# Patient Record
Sex: Female | Born: 1985 | Race: White | Hispanic: No | Marital: Married | Smoking: Never smoker
Health system: Southern US, Community
[De-identification: ages and names within clinical notes are randomized; demographics above are authoritative.]

## PROBLEM LIST (undated history)

## (undated) DIAGNOSIS — O24419 Gestational diabetes mellitus in pregnancy, unspecified control: Secondary | ICD-10-CM

## (undated) HISTORY — PX: BREAST SURGERY: SHX581

---

## 2010-07-30 LAB — HEPATITIS B SURFACE ANTIGEN: Hepatitis B Surface Ag: NEGATIVE

## 2010-07-30 LAB — RUBELLA ANTIBODY, IGM: Rubella: IMMUNE

## 2010-07-30 LAB — TYPE AND SCREEN: Antibody Screen: NEGATIVE

## 2010-07-30 LAB — GC/CHLAMYDIA PROBE AMP, GENITAL: Chlamydia: NEGATIVE

## 2011-01-30 LAB — STREP B DNA PROBE: GBS: NEGATIVE

## 2011-02-24 ENCOUNTER — Inpatient Hospital Stay (HOSPITAL_COMMUNITY): Admission: AD | Admit: 2011-02-24 | Payer: Self-pay | Source: Ambulatory Visit | Admitting: Obstetrics and Gynecology

## 2011-02-25 ENCOUNTER — Inpatient Hospital Stay (HOSPITAL_COMMUNITY)
Admission: RE | Admit: 2011-02-25 | Discharge: 2011-02-26 | DRG: 372 | Disposition: A | Discharge status: Home / Self Care | Payer: BC Managed Care – PPO | Source: Ambulatory Visit | Attending: Obstetrics and Gynecology | Admitting: Obstetrics and Gynecology

## 2011-02-25 ENCOUNTER — Encounter (HOSPITAL_COMMUNITY): Payer: Self-pay | Admitting: Anesthesiology

## 2011-02-25 ENCOUNTER — Encounter (HOSPITAL_COMMUNITY): Payer: Self-pay

## 2011-02-25 ENCOUNTER — Inpatient Hospital Stay (HOSPITAL_COMMUNITY): Payer: BC Managed Care – PPO | Admitting: Anesthesiology

## 2011-02-25 DIAGNOSIS — O24410 Gestational diabetes mellitus in pregnancy, diet controlled: Secondary | ICD-10-CM

## 2011-02-25 DIAGNOSIS — O34219 Maternal care for unspecified type scar from previous cesarean delivery: Secondary | ICD-10-CM | POA: Diagnosis present

## 2011-02-25 DIAGNOSIS — O99814 Abnormal glucose complicating childbirth: Principal | ICD-10-CM | POA: Diagnosis present

## 2011-02-25 HISTORY — DX: Gestational diabetes mellitus in pregnancy, unspecified control: O24.419

## 2011-02-25 LAB — CBC
HCT: 38.9 % (ref 36.0–46.0)
Hemoglobin: 13.6 g/dL (ref 12.0–15.0)
MCV: 96.5 fL (ref 78.0–100.0)
WBC: 14.3 10*3/uL — ABNORMAL HIGH (ref 4.0–10.5)

## 2011-02-25 MED ORDER — DIPHENHYDRAMINE HCL 50 MG/ML IJ SOLN: 12.5000 mg | INTRAMUSCULAR | Status: DC | PRN

## 2011-02-25 MED ORDER — SIMETHICONE 80 MG PO CHEW: 80.0000 mg | CHEWABLE_TABLET | ORAL | Status: DC | PRN

## 2011-02-25 MED ORDER — ONDANSETRON HCL 4 MG PO TABS: 4.0000 mg | ORAL_TABLET | ORAL | Status: DC | PRN

## 2011-02-25 MED ORDER — MEASLES, MUMPS & RUBELLA VAC ~~LOC~~ INJ
0.5000 mL | INJECTION | Freq: Once | SUBCUTANEOUS | Status: DC
  Filled 2011-02-25: qty 0.5

## 2011-02-25 MED ORDER — EPHEDRINE 5 MG/ML INJ
10.0000 mg | INTRAVENOUS | Status: DC | PRN
  Filled 2011-02-25: qty 4

## 2011-02-25 MED ORDER — CITRIC ACID-SODIUM CITRATE 334-500 MG/5ML PO SOLN: 30.0000 mL | ORAL | Status: DC | PRN

## 2011-02-25 MED ORDER — OXYTOCIN 20 UNITS IN LACTATED RINGERS INFUSION - SIMPLE: 1.0000 m[IU]/min | INTRAVENOUS | Status: DC

## 2011-02-25 MED ORDER — FLEET ENEMA 7-19 GM/118ML RE ENEM: 1.0000 | ENEMA | RECTAL | Status: DC | PRN

## 2011-02-25 MED ORDER — WITCH HAZEL-GLYCERIN EX PADS
1.0000 "application " | MEDICATED_PAD | CUTANEOUS | Status: DC | PRN
  Administered 2011-02-25: 1 via TOPICAL

## 2011-02-25 MED ORDER — OXYTOCIN 20 UNITS IN LACTATED RINGERS INFUSION - SIMPLE
125.0000 mL/h | Freq: Once | INTRAVENOUS | Status: DC
  Administered 2011-02-25: 125 mL/h via INTRAVENOUS
  Filled 2011-02-25: qty 1000

## 2011-02-25 MED ORDER — FENTANYL 2.5 MCG/ML BUPIVACAINE 1/10 % EPIDURAL INFUSION (WH - ANES)
14.0000 mL/h | INTRAMUSCULAR | Status: DC
  Administered 2011-02-25: 13 mL/h via EPIDURAL
  Filled 2011-02-25: qty 60

## 2011-02-25 MED ORDER — IBUPROFEN 600 MG PO TABS
600.0000 mg | ORAL_TABLET | Freq: Four times a day (QID) | ORAL | Status: DC | PRN
  Administered 2011-02-25: 600 mg via ORAL
  Filled 2011-02-25: qty 1

## 2011-02-25 MED ORDER — PRENATAL PLUS 27-1 MG PO TABS
1.0000 | ORAL_TABLET | Freq: Every day | ORAL | Status: DC
  Administered 2011-02-25 – 2011-02-26 (×2): 1 via ORAL
  Filled 2011-02-25 (×2): qty 1

## 2011-02-25 MED ORDER — LACTATED RINGERS IV SOLN: 500.0000 mL | INTRAVENOUS | Status: DC | PRN

## 2011-02-25 MED ORDER — SENNOSIDES-DOCUSATE SODIUM 8.6-50 MG PO TABS
2.0000 | ORAL_TABLET | Freq: Every day | ORAL | Status: DC
  Administered 2011-02-25: 2 via ORAL

## 2011-02-25 MED ORDER — LACTATED RINGERS IV SOLN
500.0000 mL | Freq: Once | INTRAVENOUS | Status: AC
  Administered 2011-02-25: 12:00:00 via INTRAVENOUS

## 2011-02-25 MED ORDER — ONDANSETRON HCL 4 MG/2ML IJ SOLN: 4.0000 mg | Freq: Four times a day (QID) | INTRAMUSCULAR | Status: DC | PRN

## 2011-02-25 MED ORDER — MEDROXYPROGESTERONE ACETATE 150 MG/ML IM SUSP: 150.0000 mg | INTRAMUSCULAR | Status: DC | PRN

## 2011-02-25 MED ORDER — PHENYLEPHRINE 40 MCG/ML (10ML) SYRINGE FOR IV PUSH (FOR BLOOD PRESSURE SUPPORT)
80.0000 ug | PREFILLED_SYRINGE | INTRAVENOUS | Status: DC | PRN
  Filled 2011-02-25 (×2): qty 5

## 2011-02-25 MED ORDER — TERBUTALINE SULFATE 1 MG/ML IJ SOLN: 0.2500 mg | Freq: Once | INTRAMUSCULAR | Status: DC | PRN

## 2011-02-25 MED ORDER — LACTATED RINGERS IV SOLN
INTRAVENOUS | Status: DC
  Administered 2011-02-25 (×2): via INTRAVENOUS

## 2011-02-25 MED ORDER — ACETAMINOPHEN 325 MG PO TABS: 650.0000 mg | ORAL_TABLET | ORAL | Status: DC | PRN

## 2011-02-25 MED ORDER — DIBUCAINE 1 % RE OINT: 1.0000 "application " | TOPICAL_OINTMENT | RECTAL | Status: DC | PRN

## 2011-02-25 MED ORDER — TETANUS-DIPHTH-ACELL PERTUSSIS 5-2.5-18.5 LF-MCG/0.5 IM SUSP
0.5000 mL | Freq: Once | INTRAMUSCULAR | Status: DC
  Filled 2011-02-25: qty 0.5

## 2011-02-25 MED ORDER — BENZOCAINE-MENTHOL 20-0.5 % EX AERO: 1.0000 "application " | INHALATION_SPRAY | CUTANEOUS | Status: DC | PRN

## 2011-02-25 MED ORDER — OXYCODONE-ACETAMINOPHEN 5-325 MG PO TABS: 2.0000 | ORAL_TABLET | ORAL | Status: DC | PRN

## 2011-02-25 MED ORDER — DIPHENHYDRAMINE HCL 25 MG PO CAPS: 25.0000 mg | ORAL_CAPSULE | Freq: Four times a day (QID) | ORAL | Status: DC | PRN

## 2011-02-25 MED ORDER — EPHEDRINE 5 MG/ML INJ
10.0000 mg | INTRAVENOUS | Status: DC | PRN
  Filled 2011-02-25 (×2): qty 4

## 2011-02-25 MED ORDER — ONDANSETRON HCL 4 MG/2ML IJ SOLN: 4.0000 mg | INTRAMUSCULAR | Status: DC | PRN

## 2011-02-25 MED ORDER — LIDOCAINE HCL (PF) 1 % IJ SOLN
30.0000 mL | INTRAMUSCULAR | Status: DC | PRN
  Administered 2011-02-25: 30 mL via SUBCUTANEOUS
  Filled 2011-02-25: qty 30

## 2011-02-25 MED ORDER — PHENYLEPHRINE 40 MCG/ML (10ML) SYRINGE FOR IV PUSH (FOR BLOOD PRESSURE SUPPORT)
80.0000 ug | PREFILLED_SYRINGE | INTRAVENOUS | Status: DC | PRN
  Filled 2011-02-25: qty 5

## 2011-02-25 MED ORDER — IBUPROFEN 600 MG PO TABS
600.0000 mg | ORAL_TABLET | Freq: Four times a day (QID) | ORAL | Status: DC
  Administered 2011-02-25 – 2011-02-26 (×4): 600 mg via ORAL
  Filled 2011-02-25 (×4): qty 1

## 2011-02-25 MED ORDER — OXYCODONE-ACETAMINOPHEN 5-325 MG PO TABS
1.0000 | ORAL_TABLET | ORAL | Status: DC | PRN
  Filled 2011-02-25: qty 1

## 2011-02-25 MED ORDER — LANOLIN HYDROUS EX OINT: TOPICAL_OINTMENT | CUTANEOUS | Status: DC | PRN

## 2011-02-25 NOTE — Progress Notes (Signed)
Getting comfortable w/ epidural.  Was having increased ctx pain.  FHT reassuring w/ occasional late decels after epidural.  Improving now with position change Toco Q3 Cvx 4/90/-1 per RN exam  A/P:  Exp mngt.

## 2011-02-25 NOTE — Progress Notes (Signed)
No complaints  FHT reassuring w/ accels Toco Q5-7 Cvx 3/50/-2  A/P:  Pt declines pitocin,  rec to recheck in 1hr, if no change will start pitocin.

## 2011-02-25 NOTE — Anesthesia Preprocedure Evaluation (Signed)
Anesthesia Evaluation  Name, MR# and DOB Patient awake  General Assessment Comment  Reviewed: Allergy & Precautions, H&P  and Patient's Chart, lab work & pertinent test results  Airway Mallampati: IV TM Distance: >3 FB Neck ROM: full    Dental No notable dental hx    Pulmonaryneg pulmonary ROS    clear to auscultation  pulmonary exam normal   Cardiovascular regular Normal   Neuro/PsychNegative Neurological ROS Negative Psych ROS  GI/Hepatic/Renal negative GI ROS, negative Liver ROS, and negative Renal ROS (+)       Endo/Other  Negative Endocrine ROS (+) Diabetes mellitus- Morbid obesity Abdominal   Musculoskeletal  Hematology negative hematology ROS (+)   Peds  Reproductive/Obstetrics (+) Pregnancy   Anesthesia Other Findings             Anesthesia Physical Anesthesia Plan  ASA: III  Anesthesia Plan: Epidural   Post-op Pain Management:    Induction:   Airway Management Planned:   Additional Equipment:   Intra-op Plan:   Post-operative Plan:   Informed Consent: I have reviewed the patients History and Physical, chart, labs and discussed the procedure including the risks, benefits and alternatives for the proposed anesthesia with the patient or authorized representative who has indicated his/her understanding and acceptance.     Plan Discussed with:   Anesthesia Plan Comments:         Anesthesia Quick Evaluation

## 2011-02-25 NOTE — H&P (Signed)
Subjective:  Helen Ingram is a 25 y.o. G2 P1 at [redacted] weeks gestation who is being admitted for induction of labor.  Her current obstetrical history is significant for gestational DM.  Patient reports occasional contractions.   Fetal Movement: normal.     Objective:   Vital signs in last 24 hours: Temp:  [98.8 F (37.1 C)] 98.8 F (37.1 C) (08/02 0757) Weight:  [105.688 kg (233 lb)] 233 lb (105.688 kg) (08/02 0757)   General:   alert and cooperative  Skin:   normal  HEENT:  extra ocular movement intact  Lungs:   clear to auscultation bilaterally  Heart:   regular rate and rhythm  Breasts:  deferred  Abdomen:  gravid, NT  Pelvis:  Exam deferred.  FHT:  reassuring  Uterine Size: size equals dates  Presentations: cephalic  Cervix:    Dilation: 3cm   Effacement: 50%   Station:  -2   Consistency: soft   Position: middle   Lab Review  GBS negative  One hour GTT: she is diet controlled    Assessment/Plan:  [redacted] weeks gestation. VBAC Obstetrical history significant for gestational DM, diet controlled.     Risks, benefits, alternatives and possible complications have been discussed in detail with the patient.  Pre-admission, admission, and post admission procedures and expectations were discussed in detail.  All questions answered, all appropriate consents will be signed at the Hospital. Admission is planned for today.  Plan for AROM and pitocin induction if needed after AROM.  R/B/A of inducion of labor and VBAC d/w pt and husband including increased risk for uterine rupture which can lead to maternal and fetal morbidity and mortality.  informed consent obtained.  Pt declines repeat c-section

## 2011-02-25 NOTE — Progress Notes (Signed)
Delivery Note At 12:53 PM a viable female was delivered via SVD.  APGAR: 9 , 9 ; weight pending .   Placenta status: spontaneous, intact w/ 3VC. Cord pH: pending  Anesthesia:  Epidural, local Episiotomy: none Lacerations: 2nd degree Suture Repair: 3.0 vicryl rapide Est. Blood Loss (mL): <500cc  Mom to postpartum.  Baby to nursery-stable.  Micholas Drumwright 02/25/2011, 1:14 PM

## 2011-02-25 NOTE — Progress Notes (Signed)
Basic teaching. IN shells.  DEBP r/t  Br. Red. F/U tomorrow.

## 2011-02-25 NOTE — Progress Notes (Signed)
No complaints,  FHT reassuring Toco: light, Q3-74min Cvx: 3/50/-2 AROM:  Clear  A/P:  Recheck cvx in 2hrs, if no change will augment w/ pitocin

## 2011-02-25 NOTE — Progress Notes (Signed)
Pt feeling sharp pains with Dr Renaldo Fiddler suturing, MD using lidocaine

## 2011-02-25 NOTE — Anesthesia Procedure Notes (Signed)
Epidural Patient location during procedure: OB Start time: 02/25/2011 11:39 AM  Staffing Performed by: anesthesiologist   Preanesthetic Checklist Completed: patient identified, site marked, surgical consent, pre-op evaluation, timeout performed, IV checked, risks and benefits discussed and monitors and equipment checked  Epidural Patient position: sitting Prep: site prepped and draped and DuraPrep Patient monitoring: continuous pulse ox and blood pressure Approach: midline Injection technique: LOR saline  Needle:  Needle type: Tuohy  Needle gauge: 17 G Needle length: 9 cm Needle insertion depth: 7 cm Catheter type: closed end flexible Catheter size: 19 Gauge Catheter at skin depth: 12 cm Test dose: negative  Assessment Events: blood not aspirated, injection not painful, no injection resistance, negative IV test and no paresthesia  Additional Notes Patient identified.  Risk benefits discussed including failed block, incomplete pain control, headache, nerve damage, paralysis, blood pressure changes, nausea, vomiting, reactions to medication both toxic or allergic, and postpartum back pain.  Patient expressed understanding and wished to proceed.  All questions were answered.  Sterile technique used throughout procedure and epidural site dressed with sterile barrier dressing. No paresthesia or other complications noted. 5 minutes prior to starting epidural infusion of fentanyl and bupivicaine, the epidural was loaded with 9 ml of 1.5% lidocaine in three separate doses.  The patient did not experience any signs of intravascular injection such as tinnitus or metallic taste in mouth nor signs of intrathecal spread such as rapid motor block. Please see nursing notes for vital signs.

## 2011-02-25 NOTE — Progress Notes (Signed)
Assumed care of pt from [facility] RN for transfer. Report given to [facility]

## 2011-02-26 ENCOUNTER — Encounter (HOSPITAL_COMMUNITY)
Admission: RE | Admit: 2011-02-26 | Discharge: 2011-02-26 | Disposition: A | Discharge status: Home / Self Care | Payer: BC Managed Care – PPO | Source: Ambulatory Visit | Attending: Obstetrics and Gynecology | Admitting: Obstetrics and Gynecology

## 2011-02-26 DIAGNOSIS — O923 Agalactia: Secondary | ICD-10-CM | POA: Insufficient documentation

## 2011-02-26 LAB — CBC
HCT: 34.4 % — ABNORMAL LOW (ref 36.0–46.0)
MCH: 32.8 pg (ref 26.0–34.0)
MCV: 98 fL (ref 78.0–100.0)
RDW: 13.8 % (ref 11.5–15.5)
WBC: 15 10*3/uL — ABNORMAL HIGH (ref 4.0–10.5)

## 2011-02-26 MED ORDER — IBUPROFEN 600 MG PO TABS: 600.0000 mg | ORAL_TABLET | Freq: Four times a day (QID) | ORAL | Status: AC

## 2011-02-26 NOTE — Progress Notes (Signed)
2nd visit : Mom trying to latch on left breast , more challenging latch , infant unable sustain latch completely over the aerolo ,added SNS with aliitle  More success . Took about 5ml of formula . Unable to keep latched on left breast to be considered a good latch . Changed back to right breast with SNS AND WAS ABLE TO SUSTAIN LATCH WITH GOOD DEPTH AND CONSISTENT SWALLOWS AND PATTERN . TOOK MORE FORMULA FROM SNS.SINCE Mom and Dad requested early D/C and pediatrician wrote D/c - Plan of Care as follows. Parents renting [employer] with instructions, LC recommended to massage breast ,hand express and prepump both breast 3-57min. Then have Dad set up SNS with formula or EBM , Work on latching on the breast that is easy to latch (which is the right ) with SNS . If unable to latch on the left , pump . Plan to followup with "WEIGHT CHECKS -2 in 1st week of life and weekly until milk supply is determined. Stressed the importance to parents caloric intake for infant and also working on milk supply . Also encouraged lots of STS ,especially with feedings . Reminded mom the [facility] O[facility]P apts. Encouraged to call once milk is in. Marissa Cox RN had mentioned to [employer] parents were questioning whether they would go home today .

## 2011-02-26 NOTE — Progress Notes (Signed)
Post Partum Day 1 Subjective: no complaints, up ad lib and voiding. Desires early discharge  Objective: Blood pressure 109/66, pulse 72, temperature 98.1 F (36.7 C), temperature source Oral, resp. rate 18, height 5\' 4"  (1.626 m), weight 105.688 kg (233 lb), SpO2 98.00%, unknown if currently breastfeeding.  Physical Exam:  General: alert and cooperative Lochia: appropriate Uterine Fundus: firm Perineum intact DVT Evaluation: No evidence of DVT seen on physical exam.   Basename 02/26/11 0520 02/25/11 0805  HGB 11.5* 13.6  HCT 34.4* 38.9    Assessment/Plan: Discharge home   LOS: 1 day   CURTIS,CAROL G 02/26/2011, 7:55 AM

## 2011-02-26 NOTE — Progress Notes (Addendum)
Lactation Consult  Per: Mom attempt with 1st baby., (already had breast reduction ) . Per mom very little yield . According to the chart latch scores have been 4-5 . Today infant has been sleepy , @consult  woke infant up showed mom how to breast massage , hand express with several drops of clostrum noted . After several attempts latched with assistance by sandwich compressable aerolo and infant was able to sustain latch for 20 min with some swallows. Mom and dad very pleased . Discussed with Mom and dad with Hx. Of Breast reduction it is a wait and see situation , and consistent pumping  is majorly important .

## 2011-02-26 NOTE — Discharge Summary (Signed)
Obstetric Discharge Summary Reason for Admission: induction of labor Prenatal Procedures: none Intrapartum Procedures: spontaneous vaginal delivery Postpartum Procedures: none Complications-Operative and Postpartum: 2 degree perineal laceration Hemoglobin  Date Value Range Status  02/26/2011 11.5* 12.0-15.0 (g/dL) Final     HCT  Date Value Range Status  02/26/2011 34.4* 36.0-46.0 (%) Final    Discharge Diagnoses: Term Pregnancy-delivered  Discharge Information: Date: 02/26/2011 Activity: pelvic rest Diet: routine Medications: PNV and Ibuprophen Condition: stable Instructions: refer to practice specific booklet Discharge to: home   Newborn Data: Live born female  Birth Weight: 7 lb 1.6 oz (3221 g) APGAR: 9, 9  Home with mother.  CURTIS,CAROL G 02/26/2011, 8:04 AM

## 2011-02-27 NOTE — Anesthesia Postprocedure Evaluation (Signed)
  Anesthesia Post Note  Patient: Helen Ingram  Procedure(s) Performed: * No procedures listed *  Anesthesia type: Epidural  Patient location: Mother/Baby  Post pain: Pain level controlled  Post assessment: Post-op Vital signs reviewed  Last Vitals: There were no vitals filed for this visit.  Post vital signs: Reviewed  Level of consciousness: awake  Complications: No apparent anesthesia complications

## 2011-03-30 ENCOUNTER — Encounter (HOSPITAL_COMMUNITY)
Admission: RE | Admit: 2011-03-30 | Discharge: 2011-03-30 | Disposition: A | Discharge status: Home / Self Care | Payer: BC Managed Care – PPO | Source: Ambulatory Visit | Attending: Obstetrics and Gynecology | Admitting: Obstetrics and Gynecology

## 2011-03-30 DIAGNOSIS — O923 Agalactia: Secondary | ICD-10-CM | POA: Insufficient documentation

## 2013-04-03 ENCOUNTER — Other Ambulatory Visit: Payer: Self-pay | Admitting: Obstetrics and Gynecology

## 2013-04-03 DIAGNOSIS — Z9889 Other specified postprocedural states: Secondary | ICD-10-CM

## 2013-04-03 DIAGNOSIS — N644 Mastodynia: Secondary | ICD-10-CM

## 2013-04-06 ENCOUNTER — Ambulatory Visit
Admission: RE | Admit: 2013-04-06 | Discharge: 2013-04-06 | Disposition: A | Discharge status: Home / Self Care | Payer: BC Managed Care – PPO | Source: Ambulatory Visit | Attending: Obstetrics and Gynecology | Admitting: Obstetrics and Gynecology

## 2013-04-06 DIAGNOSIS — N644 Mastodynia: Secondary | ICD-10-CM

## 2013-04-06 DIAGNOSIS — Z9889 Other specified postprocedural states: Secondary | ICD-10-CM

## 2014-05-27 ENCOUNTER — Encounter (HOSPITAL_COMMUNITY): Payer: Self-pay

## 2015-07-27 NOTE — L&D Delivery Note (Signed)
Delivery Note/VBAC  SVD viable female Apgars 9,9 over 1st degree ML Lac.  Placenta delivered spontaneously intact with 3VC. Repair with 3-0 Chromic with good support and hemostasis noted and R/V exam confirms.  PH art was sent.  [place] cord blood was not done.  Mother and baby were doing well.  EBL 100cc  Candice Campavid Sheri Prows, MD

## 2015-09-29 LAB — OB RESULTS CONSOLE ABO/RH: RH Type: POSITIVE

## 2015-09-29 LAB — OB RESULTS CONSOLE GC/CHLAMYDIA
CHLAMYDIA, DNA PROBE: NEGATIVE
GC PROBE AMP, GENITAL: NEGATIVE

## 2015-09-29 LAB — OB RESULTS CONSOLE RUBELLA ANTIBODY, IGM: RUBELLA: IMMUNE

## 2015-09-29 LAB — OB RESULTS CONSOLE RPR: RPR: NONREACTIVE

## 2015-09-29 LAB — OB RESULTS CONSOLE ANTIBODY SCREEN: Antibody Screen: NEGATIVE

## 2015-09-29 LAB — OB RESULTS CONSOLE HEPATITIS B SURFACE ANTIGEN: Hepatitis B Surface Ag: NEGATIVE

## 2015-09-29 LAB — OB RESULTS CONSOLE HIV ANTIBODY (ROUTINE TESTING): HIV: NONREACTIVE

## 2016-04-22 ENCOUNTER — Telehealth (HOSPITAL_COMMUNITY): Payer: Self-pay | Admitting: *Deleted

## 2016-04-22 ENCOUNTER — Encounter (HOSPITAL_COMMUNITY): Payer: Self-pay | Admitting: *Deleted

## 2016-04-22 NOTE — Telephone Encounter (Signed)
Preadmission screen  

## 2016-04-27 ENCOUNTER — Inpatient Hospital Stay (HOSPITAL_COMMUNITY): Payer: BLUE CROSS/BLUE SHIELD | Admitting: Anesthesiology

## 2016-04-27 ENCOUNTER — Encounter (HOSPITAL_COMMUNITY): Payer: Self-pay

## 2016-04-27 ENCOUNTER — Inpatient Hospital Stay (HOSPITAL_COMMUNITY)
Admission: RE | Admit: 2016-04-27 | Discharge: 2016-04-28 | DRG: 775 | Disposition: A | Discharge status: Home / Self Care | Payer: BLUE CROSS/BLUE SHIELD | Source: Ambulatory Visit | Attending: Obstetrics and Gynecology | Admitting: Obstetrics and Gynecology

## 2016-04-27 DIAGNOSIS — Z823 Family history of stroke: Secondary | ICD-10-CM

## 2016-04-27 DIAGNOSIS — O24425 Gestational diabetes mellitus in childbirth, controlled by oral hypoglycemic drugs: Secondary | ICD-10-CM | POA: Diagnosis present

## 2016-04-27 DIAGNOSIS — Z8249 Family history of ischemic heart disease and other diseases of the circulatory system: Secondary | ICD-10-CM | POA: Diagnosis not present

## 2016-04-27 DIAGNOSIS — Z3490 Encounter for supervision of normal pregnancy, unspecified, unspecified trimester: Secondary | ICD-10-CM

## 2016-04-27 DIAGNOSIS — O34211 Maternal care for low transverse scar from previous cesarean delivery: Secondary | ICD-10-CM | POA: Diagnosis present

## 2016-04-27 DIAGNOSIS — Z3A38 38 weeks gestation of pregnancy: Secondary | ICD-10-CM

## 2016-04-27 DIAGNOSIS — Z7984 Long term (current) use of oral hypoglycemic drugs: Secondary | ICD-10-CM | POA: Diagnosis not present

## 2016-04-27 DIAGNOSIS — Z833 Family history of diabetes mellitus: Secondary | ICD-10-CM | POA: Diagnosis not present

## 2016-04-27 LAB — TYPE AND SCREEN
ABO/RH(D): A POS
Antibody Screen: NEGATIVE

## 2016-04-27 LAB — CBC
HEMATOCRIT: 38.1 % (ref 36.0–46.0)
HEMOGLOBIN: 13.3 g/dL (ref 12.0–15.0)
MCH: 33.3 pg (ref 26.0–34.0)
MCHC: 34.9 g/dL (ref 30.0–36.0)
MCV: 95.5 fL (ref 78.0–100.0)
Platelets: 179 10*3/uL (ref 150–400)
RBC: 3.99 MIL/uL (ref 3.87–5.11)
RDW: 13.3 % (ref 11.5–15.5)
WBC: 8.6 10*3/uL (ref 4.0–10.5)

## 2016-04-27 LAB — GLUCOSE, CAPILLARY
GLUCOSE-CAPILLARY: 70 mg/dL (ref 65–99)
Glucose-Capillary: 87 mg/dL (ref 65–99)

## 2016-04-27 LAB — RPR: RPR Ser Ql: NONREACTIVE

## 2016-04-27 LAB — GLUCOSE, RANDOM: GLUCOSE: 96 mg/dL (ref 65–99)

## 2016-04-27 LAB — ABO/RH: ABO/RH(D): A POS

## 2016-04-27 LAB — OB RESULTS CONSOLE GBS: STREP GROUP B AG: NEGATIVE

## 2016-04-27 MED ORDER — LIDOCAINE HCL (PF) 1 % IJ SOLN
30.0000 mL | INTRAMUSCULAR | Status: DC | PRN
  Filled 2016-04-27: qty 30

## 2016-04-27 MED ORDER — OXYCODONE-ACETAMINOPHEN 5-325 MG PO TABS: 1.0000 | ORAL_TABLET | ORAL | Status: DC | PRN

## 2016-04-27 MED ORDER — COCONUT OIL OIL: 1.0000 "application " | TOPICAL_OIL | Status: DC | PRN

## 2016-04-27 MED ORDER — ACETAMINOPHEN 325 MG PO TABS: 650.0000 mg | ORAL_TABLET | ORAL | Status: DC | PRN

## 2016-04-27 MED ORDER — BENZOCAINE-MENTHOL 20-0.5 % EX AERO
1.0000 "application " | INHALATION_SPRAY | CUTANEOUS | Status: DC | PRN
  Administered 2016-04-27: 1 via TOPICAL
  Filled 2016-04-27: qty 56

## 2016-04-27 MED ORDER — OXYTOCIN BOLUS FROM INFUSION: 500.0000 mL | Freq: Once | INTRAVENOUS | Status: DC

## 2016-04-27 MED ORDER — EPHEDRINE 5 MG/ML INJ
10.0000 mg | INTRAVENOUS | Status: DC | PRN
  Filled 2016-04-27: qty 4

## 2016-04-27 MED ORDER — TETANUS-DIPHTH-ACELL PERTUSSIS 5-2.5-18.5 LF-MCG/0.5 IM SUSP: 0.5000 mL | Freq: Once | INTRAMUSCULAR | Status: DC

## 2016-04-27 MED ORDER — PRENATAL MULTIVITAMIN CH
1.0000 | ORAL_TABLET | Freq: Every day | ORAL | Status: DC
  Administered 2016-04-28: 1 via ORAL
  Filled 2016-04-27: qty 1

## 2016-04-27 MED ORDER — PHENYLEPHRINE 40 MCG/ML (10ML) SYRINGE FOR IV PUSH (FOR BLOOD PRESSURE SUPPORT)
PREFILLED_SYRINGE | INTRAVENOUS | Status: AC
  Filled 2016-04-27: qty 20

## 2016-04-27 MED ORDER — FLEET ENEMA 7-19 GM/118ML RE ENEM: 1.0000 | ENEMA | RECTAL | Status: DC | PRN

## 2016-04-27 MED ORDER — LACTATED RINGERS IV SOLN: 500.0000 mL | INTRAVENOUS | Status: DC | PRN

## 2016-04-27 MED ORDER — ONDANSETRON HCL 4 MG/2ML IJ SOLN: 4.0000 mg | Freq: Four times a day (QID) | INTRAMUSCULAR | Status: DC | PRN

## 2016-04-27 MED ORDER — OXYCODONE-ACETAMINOPHEN 5-325 MG PO TABS: 2.0000 | ORAL_TABLET | ORAL | Status: DC | PRN

## 2016-04-27 MED ORDER — IBUPROFEN 600 MG PO TABS
600.0000 mg | ORAL_TABLET | Freq: Four times a day (QID) | ORAL | Status: DC
  Administered 2016-04-27 – 2016-04-28 (×4): 600 mg via ORAL
  Filled 2016-04-27 (×4): qty 1

## 2016-04-27 MED ORDER — OXYTOCIN 40 UNITS IN LACTATED RINGERS INFUSION - SIMPLE MED: 2.5000 [IU]/h | INTRAVENOUS | Status: DC

## 2016-04-27 MED ORDER — WITCH HAZEL-GLYCERIN EX PADS: 1.0000 "application " | MEDICATED_PAD | CUTANEOUS | Status: DC | PRN

## 2016-04-27 MED ORDER — SIMETHICONE 80 MG PO CHEW: 80.0000 mg | CHEWABLE_TABLET | ORAL | Status: DC | PRN

## 2016-04-27 MED ORDER — MEDROXYPROGESTERONE ACETATE 150 MG/ML IM SUSP: 150.0000 mg | INTRAMUSCULAR | Status: DC | PRN

## 2016-04-27 MED ORDER — LACTATED RINGERS IV SOLN: 500.0000 mL | Freq: Once | INTRAVENOUS | Status: DC

## 2016-04-27 MED ORDER — PHENYLEPHRINE 40 MCG/ML (10ML) SYRINGE FOR IV PUSH (FOR BLOOD PRESSURE SUPPORT)
80.0000 ug | PREFILLED_SYRINGE | INTRAVENOUS | Status: DC | PRN
  Filled 2016-04-27: qty 5

## 2016-04-27 MED ORDER — ZOLPIDEM TARTRATE 5 MG PO TABS: 5.0000 mg | ORAL_TABLET | Freq: Every evening | ORAL | Status: DC | PRN

## 2016-04-27 MED ORDER — MEASLES, MUMPS & RUBELLA VAC ~~LOC~~ INJ: 0.5000 mL | INJECTION | Freq: Once | SUBCUTANEOUS | Status: DC

## 2016-04-27 MED ORDER — SENNOSIDES-DOCUSATE SODIUM 8.6-50 MG PO TABS
2.0000 | ORAL_TABLET | ORAL | Status: DC
  Administered 2016-04-28: 2 via ORAL
  Filled 2016-04-27: qty 2

## 2016-04-27 MED ORDER — DIPHENHYDRAMINE HCL 25 MG PO CAPS: 25.0000 mg | ORAL_CAPSULE | Freq: Four times a day (QID) | ORAL | Status: DC | PRN

## 2016-04-27 MED ORDER — DIPHENHYDRAMINE HCL 50 MG/ML IJ SOLN: 12.5000 mg | INTRAMUSCULAR | Status: DC | PRN

## 2016-04-27 MED ORDER — ONDANSETRON HCL 4 MG/2ML IJ SOLN: 4.0000 mg | INTRAMUSCULAR | Status: DC | PRN

## 2016-04-27 MED ORDER — TERBUTALINE SULFATE 1 MG/ML IJ SOLN
0.2500 mg | Freq: Once | INTRAMUSCULAR | Status: DC | PRN
  Filled 2016-04-27: qty 1

## 2016-04-27 MED ORDER — FENTANYL 2.5 MCG/ML BUPIVACAINE 1/10 % EPIDURAL INFUSION (WH - ANES)
INTRAMUSCULAR | Status: AC
  Filled 2016-04-27: qty 125

## 2016-04-27 MED ORDER — OXYTOCIN 40 UNITS IN LACTATED RINGERS INFUSION - SIMPLE MED
1.0000 m[IU]/min | INTRAVENOUS | Status: DC
  Administered 2016-04-27: 4 m[IU]/min via INTRAVENOUS
  Administered 2016-04-27: 2 m[IU]/min via INTRAVENOUS
  Filled 2016-04-27: qty 1000

## 2016-04-27 MED ORDER — LIDOCAINE HCL (PF) 1 % IJ SOLN
INTRAMUSCULAR | Status: DC | PRN
  Administered 2016-04-27: 6 mL via EPIDURAL
  Administered 2016-04-27: 4 mL

## 2016-04-27 MED ORDER — SOD CITRATE-CITRIC ACID 500-334 MG/5ML PO SOLN: 30.0000 mL | ORAL | Status: DC | PRN

## 2016-04-27 MED ORDER — ONDANSETRON HCL 4 MG PO TABS: 4.0000 mg | ORAL_TABLET | ORAL | Status: DC | PRN

## 2016-04-27 MED ORDER — LACTATED RINGERS IV SOLN
INTRAVENOUS | Status: DC
  Administered 2016-04-27: 1000 mL via INTRAVENOUS

## 2016-04-27 MED ORDER — DIBUCAINE 1 % RE OINT: 1.0000 "application " | TOPICAL_OINTMENT | RECTAL | Status: DC | PRN

## 2016-04-27 MED ORDER — FENTANYL 2.5 MCG/ML BUPIVACAINE 1/10 % EPIDURAL INFUSION (WH - ANES): 14.0000 mL/h | INTRAMUSCULAR | Status: DC | PRN

## 2016-04-27 NOTE — Progress Notes (Signed)
This note also relates to the following rows which could not be included: Dose (milli-units/min) Oxytocin - Cannot attach notes to extension rows Rate (mL/hr) Oxytocin - Cannot attach notes to extension rows Concentration Oxytocin - Cannot attach notes to extension rows  Dr Rana Snarelowe called for update, wants pit to be turned up.

## 2016-04-27 NOTE — Anesthesia Postprocedure Evaluation (Signed)
Anesthesia Post Note  Patient: Helen Ingram  Procedure(s) Performed: * No procedures listed *  Patient location during evaluation: Mother Baby Anesthesia Type: Epidural Level of consciousness: awake and alert and patient cooperative Pain management: pain level controlled Vital Signs Assessment: post-procedure vital signs reviewed and stable Respiratory status: spontaneous breathing Cardiovascular status: blood pressure returned to baseline and stable Postop Assessment: no headache, no backache, no signs of nausea or vomiting and patient able to bend at knees Anesthetic complications: no Comments: Pt delivered immediately after placement of epidural and before epidural pump could be attached and therefore, did not receive pain relief for delivery.     Last Vitals:  Vitals:   04/27/16 1625 04/27/16 1725  BP: 113/66 135/75  Pulse: 93 85  Resp: 18 20  Temp: 36.9 C 37.1 C    Last Pain:  Vitals:   04/27/16 1736  TempSrc:   PainSc: 1    Pain Goal:                 Davier Tramell J

## 2016-04-27 NOTE — Anesthesia Pain Management Evaluation Note (Signed)
  CRNA Pain Management Visit Note  Patient: Helen Ingram, 30 y.o., female  "Hello I am a member of the anesthesia team at [facility]. We have an anesthesia team available at all times to provide care throughout the hospital, including epidural management and anesthesia for C-section. I don't know your plan for the delivery whether it a natural birth, water birth, IV sedation, nitrous supplementation, doula or epidural, but we want to meet your pain goals."   1.Was your pain managed to your expectations on prior hospitalizations?   Yes   2.What is your expectation for pain management during this hospitalization?     Labor support without medications, Epidural, IV pain meds and Nitrous Oxide  3.How can we help you reach that goal? Open to all methods of pain control.  Record the patient's initial score and the patient's pain goal.   Pain: 0  Pain Goal: 7 [facility] wants you to be able to say your pain was always managed very well.  Hady Niemczyk 04/27/2016

## 2016-04-27 NOTE — Lactation Note (Signed)
This note was copied from a baby's chart. Lactation Consultation Note  Patient Name: Boy Sonny DandyKaren Torelli ZOXWR'UToday's Date: 04/27/2016 Reason for consult: Initial assessment;Difficult latch;Breast surgery Baby 4 hours old. Mom reports that she had breast reduction surgery 10 years ago, attempted to nurse 2 older children and had trouble latching and producing enough milk. Mom states that she had to begin supplementing with formula while still in hospital. Mom states that this baby did latch earlier, but she believes baby is very hungry. Discussed progression of milk coming to volume and supply and demand. Assisted mom to hand express both breast with drops of colostrum flowing from right breast--the breast baby latched onto earlier. Baby given colostrum with spoon and finger. Assisted mom to latch baby to right breast, and after several attempts, fitted mom with a #20 NS. Mom's nipples very flat and not easily compressible. Mom's breast roll away when baby attempts to latch to NS. So, placed a rolled cloth under mom's breast and the baby able to maintain a deep latch and suckle rhythmically with a few swallows noted. Colostrum also present in NS.  Enc mom to keep offering lots of STS and latching baby with cues. Enc continuing to hand express and spoon-feed as well. Enc mom to start using DEBP if baby not at breast, and if she continues to use NS through the night. Discussed assessment and interventions with patient's bedside RN, Dot LanesKrista.   Maternal Data Has patient been taught Hand Expression?: Yes Does the patient have breastfeeding experience prior to this delivery?: Yes  Feeding Feeding Type: Breast Fed Length of feed: 25 min  LATCH Score/Interventions Latch: Grasps breast easily, tongue down, lips flanged, rhythmical sucking. Intervention(s): Adjust position;Assist with latch;Breast compression  Audible Swallowing: A few with stimulation Intervention(s): Skin to skin;Hand expression  Type of  Nipple: Flat Intervention(s):  (Discussed with patient and patient's RN, Dot LanesKrista, that mom may decide to start with DEBP later this evening.)  Comfort (Breast/Nipple): Soft / non-tender     Hold (Positioning): Assistance needed to correctly position infant at breast and maintain latch. Intervention(s): Breastfeeding basics reviewed;Support Pillows;Position options;Skin to skin  LATCH Score: 7  Lactation Tools Discussed/Used Tools: Nipple Shields Nipple shield size: 20;24   Consult Status Consult Status: Follow-up Date: 04/28/16 Follow-up type: In-patient    Sherlyn HayJennifer D Eliette Drumwright 04/27/2016, 7:46 PM

## 2016-04-27 NOTE — Anesthesia Preprocedure Evaluation (Signed)
Anesthesia Evaluation  Patient identified by MRN, date of birth, ID band Patient awake    Reviewed: Allergy & Precautions, H&P , Patient's Chart, lab work & pertinent test results  Airway Mallampati: II  TM Distance: >3 FB Neck ROM: full    Dental  (+) Teeth Intact   Pulmonary    breath sounds clear to auscultation       Cardiovascular  Rhythm:regular Rate:Normal     Neuro/Psych    GI/Hepatic   Endo/Other  diabetes, GestationalMorbid obesity  Renal/GU      Musculoskeletal   Abdominal   Peds  Hematology   Anesthesia Other Findings       Reproductive/Obstetrics (+) Pregnancy                             Anesthesia Physical Anesthesia Plan  ASA: II  Anesthesia Plan: Epidural   Post-op Pain Management:    Induction:   Airway Management Planned:   Additional Equipment:   Intra-op Plan:   Post-operative Plan:   Informed Consent: I have reviewed the patients History and Physical, chart, labs and discussed the procedure including the risks, benefits and alternatives for the proposed anesthesia with the patient or authorized representative who has indicated his/her understanding and acceptance.   Dental Advisory Given  Plan Discussed with:   Anesthesia Plan Comments: (Labs checked- platelets confirmed with RN in room. Fetal heart tracing, per RN, reported to be stable enough for sitting procedure. Discussed epidural, and patient consents to the procedure:  included risk of possible headache,backache, failed block, allergic reaction, and nerve injury. This patient was asked if she had any questions or concerns before the procedure started.)        Anesthesia Quick Evaluation

## 2016-04-27 NOTE — H&P (Signed)
Helen DandyKaren Ingram is a 30 y.o. female presenting for IOL due to term and A2DM previously in good control on glyburide, now with falling glc levels especially fasting despite no changes in diet, meds.  GBS -.  Previous LSTCS and VBAC desires TOL.. OB History    Gravida Para Term Preterm AB Living   3 2 2  0 0 2   SAB TAB Ectopic Multiple Live Births   0 0 0 0 1     Past Medical History:  Diagnosis Date  . Gestational diabetes    Past Surgical History:  Procedure Laterality Date  . BREAST SURGERY    . CESAREAN SECTION     Family History: family history includes Diabetes in her father, maternal grandfather, and paternal grandmother; Heart disease in her maternal grandfather, maternal grandmother, and paternal grandfather; Hypertension in her maternal grandfather and maternal grandmother; Stroke in her paternal grandmother. Social History:  reports that she has never smoked. She has never used smokeless tobacco. She reports that she does not drink alcohol or use drugs.     Maternal Diabetes: Yes:  Diabetes Type:  Insulin/Medication controlled Genetic Screening: Normal Maternal Ultrasounds/Referrals: Normal Fetal Ultrasounds or other Referrals:  None Maternal Substance Abuse:  No Significant Maternal Medications:  Meds include: Other: glyburide Significant Maternal Lab Results:  None Other Comments:  None  ROS History   Blood pressure (!) 138/91, pulse 92, temperature 98.2 F (36.8 Ingram), temperature source Oral, resp. rate 18, height 5\' 5"  (1.651 m), weight 233 lb (105.7 kg), last menstrual period 07/30/2015, unknown if currently breastfeeding. Exam Physical Exam  Prenatal labs: ABO, Rh: A/Positive/-- (03/06 0000) Antibody: Negative (03/06 0000) Rubella: Immune (03/06 0000) RPR: Nonreactive (03/06 0000)  HBsAg: Negative (03/06 0000)  HIV: Non-reactive (03/06 0000)  GBS: Negative (10/03 0800)   Assessment/Plan: IUP at term Prev Ingram/S and VBAC desires TOL.  R&B of VBAC discussed  including risk of uterine rupture.  Informed consent obtained. A2DM - now with falling medicine demands.  Plan IOL with pitocin and AROM    Helen Ingram 04/27/2016, 9:00 AM

## 2016-04-27 NOTE — Anesthesia Procedure Notes (Signed)

## 2016-04-28 ENCOUNTER — Inpatient Hospital Stay (HOSPITAL_COMMUNITY): Admission: RE | Admit: 2016-04-28 | Payer: Self-pay | Source: Ambulatory Visit

## 2016-04-28 LAB — CBC
HCT: 36.3 % (ref 36.0–46.0)
HEMOGLOBIN: 12.2 g/dL (ref 12.0–15.0)
MCH: 33.3 pg (ref 26.0–34.0)
MCHC: 33.6 g/dL (ref 30.0–36.0)
MCV: 99.2 fL (ref 78.0–100.0)
Platelets: 190 10*3/uL (ref 150–400)
RBC: 3.66 MIL/uL — AB (ref 3.87–5.11)
RDW: 13.3 % (ref 11.5–15.5)
WBC: 11.8 10*3/uL — ABNORMAL HIGH (ref 4.0–10.5)

## 2016-04-28 MED ORDER — IBUPROFEN 600 MG PO TABS: 600.0000 mg | ORAL_TABLET | Freq: Four times a day (QID) | ORAL | 0 refills | Status: AC

## 2016-04-28 NOTE — Progress Notes (Signed)
Post Partum Day 1 Subjective: no complaints, up ad lib, voiding and tolerating PO.  Patient requests d/c home today and circ prior to d/c.  Objective: Blood pressure 120/66, pulse 73, temperature 98.6 F (37 C), resp. rate 18, height 5\' 5"  (1.651 m), weight 233 lb (105.7 kg), last menstrual period 07/30/2015, SpO2 100 %, unknown if currently breastfeeding.  Physical Exam:  General: alert, cooperative and appears stated age Lochia: appropriate Uterine Fundus: firm Incision: healing well, no significant drainage, no dehiscence DVT Evaluation: No evidence of DVT seen on physical exam. Negative Homan's sign. No cords or calf tenderness.   Recent Labs  04/27/16 0804 04/28/16 0526  HGB 13.3 12.2  HCT 38.1 36.3    Assessment/Plan: Discharge home, Breastfeeding and Circumcision prior to discharge  Patient counseled for circ including risk of bleeding, infection, and scarring.  All questions were answered and the patient wishes to proceed.   LOS: 1 day   Donjuan Robison 04/28/2016, 8:14 AM

## 2016-04-28 NOTE — Discharge Instructions (Signed)
Call MD for T>100.4, heavy vaginal bleeding, severe abdominal pain, or respiratory distress.  Call office to schedule postpartum visit in 4-6 weeks.  Pelvic rest x 6 weeks.   °

## 2016-04-28 NOTE — Discharge Summary (Signed)
Obstetric Discharge Summary Reason for Admission: induction of labor Prenatal Procedures: none Intrapartum Procedures: spontaneous vaginal delivery, VBAC Postpartum Procedures: none Complications-Operative and Postpartum: 1st degree perineal laceration Hemoglobin  Date Value Ref Range Status  04/28/2016 12.2 12.0 - 15.0 g/dL Final   HCT  Date Value Ref Range Status  04/28/2016 36.3 36.0 - 46.0 % Final    Physical Exam:  General: alert, cooperative and appears stated age Lochia: appropriate Uterine Fundus: firm Incision: healing well, no significant drainage, no dehiscence DVT Evaluation: No evidence of DVT seen on physical exam. Negative Homan's sign. No cords or calf tenderness.  Discharge Diagnoses: Term Pregnancy-delivered  Discharge Information: Date: 04/28/2016 Activity: pelvic rest Diet: routine Medications: PNV and Ibuprofen Condition: stable Instructions: refer to practice specific booklet Discharge to: home   Newborn Data: Live born female  Birth Weight: 7 lb 8 oz (3402 g) APGAR: 9, 9  Home with mother.  Helen Ingram 04/28/2016, 8:17 AM
